# Patient Record
Sex: Male | Born: 1995 | Race: Asian | Hispanic: No | Marital: Single | Smoking: Never smoker
Health system: Southern US, Community
[De-identification: ages and names within clinical notes are randomized; demographics above are authoritative.]

---
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
## 2016-07-21 ENCOUNTER — Emergency Department (HOSPITAL_COMMUNITY): Payer: Self-pay

## 2016-07-21 ENCOUNTER — Encounter (HOSPITAL_COMMUNITY): Payer: Self-pay | Admitting: Emergency Medicine

## 2016-07-21 ENCOUNTER — Emergency Department (HOSPITAL_COMMUNITY)
Admission: EM | Admit: 2016-07-21 | Discharge: 2016-07-21 | Disposition: A | Discharge status: Home / Self Care | Payer: Self-pay | Attending: Emergency Medicine | Admitting: Emergency Medicine

## 2016-07-21 DIAGNOSIS — Y929 Unspecified place or not applicable: Secondary | ICD-10-CM | POA: Insufficient documentation

## 2016-07-21 DIAGNOSIS — Y939 Activity, unspecified: Secondary | ICD-10-CM | POA: Insufficient documentation

## 2016-07-21 DIAGNOSIS — S0101XA Laceration without foreign body of scalp, initial encounter: Secondary | ICD-10-CM | POA: Insufficient documentation

## 2016-07-21 DIAGNOSIS — Z23 Encounter for immunization: Secondary | ICD-10-CM | POA: Insufficient documentation

## 2016-07-21 DIAGNOSIS — Y999 Unspecified external cause status: Secondary | ICD-10-CM | POA: Insufficient documentation

## 2016-07-21 DIAGNOSIS — W228XXA Striking against or struck by other objects, initial encounter: Secondary | ICD-10-CM | POA: Insufficient documentation

## 2016-07-21 MED ORDER — TETANUS-DIPHTH-ACELL PERTUSSIS 5-2.5-18.5 LF-MCG/0.5 IM SUSP
0.5000 mL | Freq: Once | INTRAMUSCULAR | Status: AC
  Administered 2016-07-21: 0.5 mL via INTRAMUSCULAR
  Filled 2016-07-21: qty 0.5

## 2016-07-21 MED ORDER — LIDOCAINE-EPINEPHRINE-TETRACAINE (LET) SOLUTION: 3.0000 mL | Freq: Once | NASAL | Status: DC
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
## 2016-07-27 ENCOUNTER — Ambulatory Visit (HOSPITAL_COMMUNITY)
Admission: EM | Admit: 2016-07-27 | Discharge: 2016-07-27 | Disposition: A | Discharge status: Home / Self Care | Payer: Self-pay

## 2016-07-27 ENCOUNTER — Encounter (HOSPITAL_COMMUNITY): Payer: Self-pay | Admitting: Emergency Medicine

## 2016-07-27 NOTE — ED Triage Notes (Signed)
Pt here for staple removal of his right upper scalp.  The wound is clean, dry and intact.  Pt denies any pain, fever, or any other issues.  Two staples were removed from his scalp with no issues or bleeding.  Pt instructed to continue to keep the area clean and to pat the area dry when it is wet.  Pt verbalized understanding and tolerated removal well.
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
